# Patient Record
Sex: Female | Born: 1970 | Race: White | Hispanic: No | Marital: Married | Smoking: Never smoker
Health system: Southern US, Community
[De-identification: ages and names within clinical notes are randomized; demographics above are authoritative.]

## PROBLEM LIST (undated history)

## (undated) DIAGNOSIS — R51 Headache: Secondary | ICD-10-CM

## (undated) DIAGNOSIS — G500 Trigeminal neuralgia: Secondary | ICD-10-CM

## (undated) DIAGNOSIS — G8929 Other chronic pain: Secondary | ICD-10-CM

## (undated) DIAGNOSIS — J358 Other chronic diseases of tonsils and adenoids: Secondary | ICD-10-CM

## (undated) DIAGNOSIS — K222 Esophageal obstruction: Secondary | ICD-10-CM

## (undated) DIAGNOSIS — F419 Anxiety disorder, unspecified: Secondary | ICD-10-CM

## (undated) DIAGNOSIS — K649 Unspecified hemorrhoids: Secondary | ICD-10-CM

## (undated) DIAGNOSIS — D126 Benign neoplasm of colon, unspecified: Secondary | ICD-10-CM

## (undated) DIAGNOSIS — E559 Vitamin D deficiency, unspecified: Secondary | ICD-10-CM

## (undated) HISTORY — DX: Other chronic diseases of tonsils and adenoids: J35.8

## (undated) HISTORY — DX: Unspecified hemorrhoids: K64.9

## (undated) HISTORY — DX: Trigeminal neuralgia: G50.0

## (undated) HISTORY — DX: Benign neoplasm of colon, unspecified: D12.6

## (undated) HISTORY — DX: Esophageal obstruction: K22.2

## (undated) HISTORY — DX: Headache: R51

## (undated) HISTORY — DX: Other chronic pain: G89.29

## (undated) HISTORY — DX: Anxiety disorder, unspecified: F41.9

## (undated) HISTORY — PX: LYMPH NODE DISSECTION: SHX5087

## (undated) HISTORY — DX: Vitamin D deficiency, unspecified: E55.9

---

## 2016-11-26 DIAGNOSIS — Z68.41 Body mass index (BMI) pediatric, 5th percentile to less than 85th percentile for age: Secondary | ICD-10-CM | POA: Insufficient documentation

## 2017-03-12 DIAGNOSIS — J302 Other seasonal allergic rhinitis: Secondary | ICD-10-CM | POA: Insufficient documentation

## 2017-03-12 DIAGNOSIS — F419 Anxiety disorder, unspecified: Secondary | ICD-10-CM | POA: Insufficient documentation

## 2017-03-12 DIAGNOSIS — G518 Other disorders of facial nerve: Secondary | ICD-10-CM | POA: Insufficient documentation

## 2017-03-13 DIAGNOSIS — E559 Vitamin D deficiency, unspecified: Secondary | ICD-10-CM | POA: Insufficient documentation

## 2017-04-07 DIAGNOSIS — R1314 Dysphagia, pharyngoesophageal phase: Secondary | ICD-10-CM | POA: Insufficient documentation

## 2017-04-07 DIAGNOSIS — K219 Gastro-esophageal reflux disease without esophagitis: Secondary | ICD-10-CM | POA: Insufficient documentation

## 2017-04-08 ENCOUNTER — Other Ambulatory Visit: Payer: Self-pay | Admitting: Otolaryngology

## 2017-04-08 DIAGNOSIS — K219 Gastro-esophageal reflux disease without esophagitis: Secondary | ICD-10-CM

## 2017-04-08 DIAGNOSIS — R1314 Dysphagia, pharyngoesophageal phase: Secondary | ICD-10-CM

## 2017-07-16 ENCOUNTER — Ambulatory Visit
Admission: RE | Admit: 2017-07-16 | Discharge: 2017-07-16 | Disposition: A | Discharge status: Home / Self Care | Payer: BLUE CROSS/BLUE SHIELD | Source: Ambulatory Visit | Attending: Otolaryngology | Admitting: Otolaryngology

## 2017-07-16 ENCOUNTER — Encounter: Payer: Self-pay | Admitting: Radiology

## 2017-07-16 DIAGNOSIS — K219 Gastro-esophageal reflux disease without esophagitis: Secondary | ICD-10-CM

## 2017-07-16 DIAGNOSIS — R1314 Dysphagia, pharyngoesophageal phase: Secondary | ICD-10-CM

## 2017-08-01 ENCOUNTER — Encounter: Payer: Self-pay | Admitting: Internal Medicine

## 2017-08-29 ENCOUNTER — Encounter: Payer: Self-pay | Admitting: *Deleted

## 2017-09-10 ENCOUNTER — Encounter: Payer: Self-pay | Admitting: Internal Medicine

## 2017-09-10 ENCOUNTER — Other Ambulatory Visit (INDEPENDENT_AMBULATORY_CARE_PROVIDER_SITE_OTHER): Payer: BLUE CROSS/BLUE SHIELD

## 2017-09-10 ENCOUNTER — Ambulatory Visit (INDEPENDENT_AMBULATORY_CARE_PROVIDER_SITE_OTHER): Payer: BLUE CROSS/BLUE SHIELD | Admitting: Internal Medicine

## 2017-09-10 ENCOUNTER — Encounter (INDEPENDENT_AMBULATORY_CARE_PROVIDER_SITE_OTHER): Payer: Self-pay

## 2017-09-10 VITALS — BP 100/72 | HR 72 | Ht 61.5 in | Wt 124.4 lb

## 2017-09-10 DIAGNOSIS — R194 Change in bowel habit: Secondary | ICD-10-CM | POA: Diagnosis not present

## 2017-09-10 DIAGNOSIS — R933 Abnormal findings on diagnostic imaging of other parts of digestive tract: Secondary | ICD-10-CM | POA: Diagnosis not present

## 2017-09-10 LAB — FOLATE: FOLATE: 16.7 ng/mL (ref >5.9)

## 2017-09-10 LAB — VITAMIN B12: Vitamin B-12: 215 pg/mL (ref 211–911)

## 2017-09-10 LAB — IGA: IgA: 130 mg/dL (ref 68–378)

## 2017-09-10 MED ORDER — SUPREP BOWEL PREP KIT 17.5-3.13-1.6 GM/177ML PO SOLN: 1.0000 | ORAL | 0 refills | Status: DC

## 2017-09-10 NOTE — Patient Instructions (Signed)
You have been scheduled for an endoscopy and colonoscopy. Please follow the written instructions given to you at your visit today. Please pick up your prep supplies at the pharmacy within the next 1-3 days. If you use inhalers (even only as needed), please bring them with you on the day of your procedure. Your physician has requested that you go to www.startemmi.com and enter the access code given to you at your visit today. This web site gives a general overview about your procedure. However, you should still follow specific instructions given to you by our office regarding your preparation for the procedure.  Your physician has requested that you go to the basement for the following lab work before leaving today: Celiac, b12, folate  If you are age 46 or older, your body mass index should be between 23-30. Your Body mass index is 23.12 kg/m. If this is out of the aforementioned range listed, please consider follow up with your Primary Care Provider.  If you are age 37 or younger, your body mass index should be between 19-25. Your Body mass index is 23.12 kg/m. If this is out of the aformentioned range listed, please consider follow up with your Primary Care Provider.

## 2017-09-10 NOTE — Progress Notes (Signed)
Patient ID: Carolyn Blair, female   DOB: 03-03-1971, 46 y.o.   MRN: [id number] HPI: Carolyn Blair is a 46 year old female with a past medical history of trigeminal neuralgia, tonsillar stones, headaches, vitamin D deficiency who is seen in consultation at the request of Dr. Harmon Pier to evaluate dysphagia and abnormal barium esophagram. She is here alone today.  She states that she has been recently seen at [facility] by Dr. Nigel Bridgeman, also by ENT Dr. Janace Hoard. She developed recurrent left-sided trigeminal neuralgia with pain in her neck and left head/face, some changes in her speech as well as trouble swallowing. Her trouble swallowing she states is high in her left neck under her jaw bone. She feels like food sticks when she tries to swallow it. She does not feel esophageal dysphagia or food sticking or slowing down after swallowing. She states it is more of a problem initiating swallow rather than food going down want she swallows. She was seen by ENT and told her laryngoscopy was normal. She reportedly has a pending MRI of her brain to follow-up her neurology issues from earlier this summer. She denies heartburn, nausea and vomiting. She has had a change in bowel habit including urgent loose stools after eating. She's had some crampy lower abdominal pain. She has 3-4 loose stools per day all associated with eating. Previously she was having one formed stool per day. Loose stools do not occur every day but seemed to come in clusters of days. Worse with fast food and heavy foods such as Alfredo sauce. No blood in her stool or melena. She has noticed increase intestinal gas and some bloating. She is also noticed hair loss. She was seen by dermatology, Dr. Harlow Mares who suggested she may need a colonoscopy.   She had been using meloxicam for her headache and neck pain but has stopped this.  She had a barium esophagram which showed a smooth distal narrowing in the esophagus. The barium tablet stopped at this area and  caused esophageal dysphagia. She reports that this is not the sensation she feels in her neck.  She is married with one child. She works as an Sales promotion account executive. No tobacco use ever. No regular alcohol. No family history of celiac disease or IBD. She does have family history of pancreatic cancer in her maternal grandmother.  Her weight has been stable to slightly increased.  She is using Flexeril as needed, Xanax as needed and daily birth control pill.  Past Medical History:  Diagnosis Date  . Anxiety   . Chronic headaches   . Esophageal stricture   . Tonsil stone   . Trigeminal neuralgia   . Vitamin D deficiency     Past Surgical History:  Procedure Laterality Date  . LYMPH NODE DISSECTION Left    groin    No outpatient prescriptions prior to visit.   No facility-administered medications prior to visit.     Allergies  Allergen Reactions  . Codeine Nausea And Vomiting  . Flagyl [Metronidazole] Rash    Throat sores  . Macrobid [Nitrofurantoin] Rash    Throat sores    Family History  Problem Relation Age of Onset  . Lung cancer Mother   . Diabetes Mother   . Liver disease Sister        fatty liver  . Basal cell carcinoma Sister   . Pancreatic cancer Maternal Grandmother   . Stroke Paternal Grandmother   . Heart disease Paternal Grandfather   . Heart attack Paternal Grandfather  Social History  Substance Use Topics  . Smoking status: Never Smoker  . Smokeless tobacco: Never Used  . Alcohol use Yes     Comment: 10 beers on the weekends    ROS: As per history of present illness, otherwise negative  BP 100/72 (BP Location: Left Arm, Patient Position: Sitting, Cuff Size: Normal)   Pulse 72   Ht 5' 1.5" (1.562 m) Comment: height measured without shoes  Wt 124 lb 6 oz (56.4 kg)   LMP 08/20/2017   BMI 23.12 kg/m  Constitutional: Well-developed and well-nourished. No distress. HEENT: Normocephalic and atraumatic. Oropharynx is clear and moist.  Conjunctivae are normal.  No scleral icterus. Neck: Neck supple. Trachea midline. Cardiovascular: Normal rate, regular rhythm and intact distal pulses. No M/R/G Pulmonary/chest: Effort normal and breath sounds normal. No wheezing, rales or rhonchi. Abdominal: Soft, nontender, nondistended. Bowel sounds active throughout. There are no masses palpable. No hepatosplenomegaly. Extremities: no clubbing, cyanosis, or edema Neurological: Alert and oriented to person place and time. Skin: Skin is warm and dry.  Psychiatric: Normal mood and affect. Behavior is normal.  RELEVANT LABS AND IMAGING: Labs reviewed from care everywhere B12 217 TSH and free T4 normal Ferritin 125  ESOPHOGRAM / BARIUM SWALLOW / BARIUM TABLET STUDY   TECHNIQUE: Combined double contrast and single contrast examination performed using effervescent crystals, thick barium liquid, and thin barium liquid. The patient was observed with fluoroscopy swallowing a 13 mm barium sulphate tablet.   FLUOROSCOPY TIME:  Fluoroscopy Time:  2 minutes, 6 seconds   Radiation Exposure Index (if provided by the fluoroscopic device): 16   Number of Acquired Spot Images: 4 +multiple video loops.   COMPARISON:  None in PACs   FINDINGS: The patient ingested the thick and thin barium and effervescent crystals without difficulty. The hypopharynx distended well. There was no laryngeal penetration of the barium. The patient initiated the voluntary portion of the swallowing maneuver without difficulty. The cervical esophagus distended well. There was no cricopharyngeus muscle impression observed. The thoracic esophagus distended well down to the GE junction. Here there was mild narrowing which was not obstructive to passage of the liquid barium but was obstructive to passage of the 12 mm barium tablet. The mucosal pattern of the esophagus was normal. No gastroesophageal reflux or hiatal hernia was observed.   IMPRESSION: Normal  appearance of the voluntary portion of the swallowing maneuver. No abnormal displacement of the barium filled esophagus. Normal esophageal peristalsis.   Short-segment narrowing at the GE junction was obstructive to the passage of the barium tablet. This reproduced symptoms the patient has experienced when taking a large medication tablet. Direct visualization and consideration for possible esophageal dilation is recommended.     Electronically Signed   By: David  Martinique M.D.   On: 07/16/2017 12:42    ASSESSMENT/PLAN: 46 year old female with a past medical history of trigeminal neuralgia, tonsillar stones, headaches, vitamin D deficiency who is seen in consultation at the request of Dr. Harmon Pier to evaluate dysphagia and abnormal barium esophagram.  1. Trouble swallowing/abnormal barium esophagram -- the sensation she is feeling high in her left neck is not consistent with esophageal dysphagia. This sounds more likely to be possibly neurologic related in relation to her trigeminal neuralgia. I recommended that she follow-up with neurology and with the MRI as previously recommended. --The barium esophagram does show what is probably a benign distal esophageal stricture. Again the barium tablet did not reproduce her symptoms in her left neck but did cause esophageal dysphagia.  I recommended direct visualization and possible dilation.  We discussed the risk benefits and alternatives and she is agreeable and wishes to proceed.  2. Change in bowel habits/loose stools -- check celiac panel. Colonoscopy recommended to evaluate her change in bowel habit. Rule out IBD. We discussed the risks, benefits and alternatives and she is agreeable and wishes to proceed.  3. Hair loss -- unclear etiology. Rechecking celiac panel. Check B12 and folate.  4. Trigeminal neuralgia -- she will follow-up with neurology   Cc: Dr. Quillian Quince Pomposini

## 2017-09-11 ENCOUNTER — Other Ambulatory Visit: Payer: Self-pay

## 2017-09-11 DIAGNOSIS — E538 Deficiency of other specified B group vitamins: Secondary | ICD-10-CM

## 2017-09-11 LAB — TISSUE TRANSGLUTAMINASE, IGA: (tTG) Ab, IgA: 1 U/mL

## 2017-10-20 ENCOUNTER — Telehealth: Payer: Self-pay | Admitting: Internal Medicine

## 2017-10-20 NOTE — Telephone Encounter (Signed)
Spoke with patient and she may have a sinus infection.  States no fever, just stuffy nose. She is seeing her PCP today.  Told her to call us if she starts running a fever. Okay to take antibiotics if necessary, and decongestants. She agreed.

## 2017-10-21 ENCOUNTER — Other Ambulatory Visit: Payer: Self-pay

## 2017-10-21 ENCOUNTER — Telehealth: Payer: Self-pay | Admitting: *Deleted

## 2017-10-21 ENCOUNTER — Encounter: Payer: Self-pay | Admitting: Internal Medicine

## 2017-10-21 ENCOUNTER — Ambulatory Visit (AMBULATORY_SURGERY_CENTER): Payer: BLUE CROSS/BLUE SHIELD | Admitting: Internal Medicine

## 2017-10-21 VITALS — BP 116/74 | HR 72 | Temp 97.5°F | Resp 15 | Ht 61.0 in | Wt 124.0 lb

## 2017-10-21 DIAGNOSIS — D123 Benign neoplasm of transverse colon: Secondary | ICD-10-CM

## 2017-10-21 DIAGNOSIS — R1319 Other dysphagia: Secondary | ICD-10-CM

## 2017-10-21 DIAGNOSIS — R933 Abnormal findings on diagnostic imaging of other parts of digestive tract: Secondary | ICD-10-CM

## 2017-10-21 DIAGNOSIS — R1310 Dysphagia, unspecified: Secondary | ICD-10-CM | POA: Diagnosis not present

## 2017-10-21 DIAGNOSIS — R194 Change in bowel habit: Secondary | ICD-10-CM | POA: Diagnosis not present

## 2017-10-21 MED ORDER — RIFAXIMIN 550 MG PO TABS: 550.0000 mg | ORAL_TABLET | Freq: Three times a day (TID) | ORAL | 0 refills | Status: AC

## 2017-10-21 MED ORDER — SODIUM CHLORIDE 0.9 % IV SOLN: 500.0000 mL | INTRAVENOUS | Status: DC

## 2017-10-21 NOTE — Progress Notes (Signed)
Report to PACU, RN, vss, BBS= Clear.  

## 2017-10-21 NOTE — Op Note (Signed)
[place] Patient Name: Carolyn Blair Procedure Date: 10/21/2017 2:06 PM MRN: [id number] Endoscopist: Jerene Bears , MD Age: 46 Referring MD:  Date of Birth: May 14, 1971 Gender: Female Account #: [id number] Procedure:                Upper GI endoscopy Indications:              Dysphagia, Abnormal cine-esophagram with smooth                            narrowing in distal esophagus and transient delay                            with 13 mm barium tablet reproducing symptoms of                            dysphagia Medicines:                Monitored Anesthesia Care Procedure:                Pre-Anesthesia Assessment:                           - Prior to the procedure, a History and Physical                            was performed, and patient medications and                            allergies were reviewed. The patient's tolerance of                            previous anesthesia was also reviewed. The risks                            and benefits of the procedure and the sedation                            options and risks were discussed with the patient.                            All questions were answered, and informed consent                            was obtained. Prior Anticoagulants: The patient has                            taken no previous anticoagulant or antiplatelet                            agents. ASA Grade Assessment: II - A patient with                            mild systemic disease. After reviewing the risks  and benefits, the patient was deemed in                            satisfactory condition to undergo the procedure.                           After obtaining informed consent, the endoscope was                            passed under direct vision. Throughout the                            procedure, the patient's blood pressure, pulse, and                            oxygen saturations were monitored continuously.  The                            Endoscope was introduced through the mouth, and                            advanced to the second part of duodenum. The upper                            GI endoscopy was accomplished without difficulty.                            The patient tolerated the procedure well. Scope In: Scope Out: Findings:                 No endoscopic abnormality was evident in the                            esophagus to explain the patient's complaint of                            dysphagia. The esophageal mucosa was carefully                            inspected and appeared normal. No definite                            stricture was seen. The Z-line is regular at 38 cm                            from the incisors. It was decided, however, to                            proceed with dilation of the lower third of the                            esophagus and across the GE junction. A TTS dilator  was passed through the scope. Dilation with a                            16-17-18 mm balloon dilator was performed to 17 mm.                           The cardia and gastric fundus were normal on                            retroflexion.                           The entire examined stomach was normal.                           The examined duodenum was normal. Complications:            No immediate complications. Estimated Blood Loss:     Estimated blood loss: none. Impression:               - Normal esophagus endoscopically. No endoscopic                            esophageal abnormality to explain patient's                            dysphagia. Esophagus dilated to 17 mm.                           - Normal stomach.                           - Normal examined duodenum.                           - No specimens collected. Recommendation:           - Patient has a contact number available for                            emergencies. The signs and symptoms of  potential                            delayed complications were discussed with the                            patient. Return to normal activities tomorrow.                            Written discharge instructions were provided to the                            patient.                           - Resume previous diet.                           -  Continue present medications. Jerene Bears, MD 10/21/2017 2:47:34 PM This report has been signed electronically.

## 2017-10-21 NOTE — Progress Notes (Signed)
Called to room to assist during endoscopic procedure.  Patient ID and intended procedure confirmed with present staff. Received instructions for my participation in the procedure from the performing physician.  

## 2017-10-21 NOTE — Op Note (Signed)
[facility] Patient Name: Carolyn Blair Procedure Date: 10/21/2017 2:05 PM MRN: [id number] Endoscopist: Jerene Bears , MD Age: 46 Referring MD:  Date of Birth: 12-08-1970 Gender: Female Account #: [id number] Procedure:                Colonoscopy Indications:              Change in bowel habits Medicines:                Monitored Anesthesia Care Procedure:                Pre-Anesthesia Assessment:                           - Prior to the procedure, a History and Physical                            was performed, and patient medications and                            allergies were reviewed. The patient's tolerance of                            previous anesthesia was also reviewed. The risks                            and benefits of the procedure and the sedation                            options and risks were discussed with the patient.                            All questions were answered, and informed consent                            was obtained. Prior Anticoagulants: The patient has                            taken no previous anticoagulant or antiplatelet                            agents. ASA Grade Assessment: II - A patient with                            mild systemic disease. After reviewing the risks                            and benefits, the patient was deemed in                            satisfactory condition to undergo the procedure.                           After obtaining informed consent, the colonoscope  was passed under direct vision. Throughout the                            procedure, the patient's blood pressure, pulse, and                            oxygen saturations were monitored continuously. The                            Colonoscope was introduced through the anus and                            advanced to the the terminal ileum. The colonoscopy                            was performed without difficulty. The  patient                            tolerated the procedure well. The quality of the                            bowel preparation was excellent. The terminal                            ileum, ileocecal valve, appendiceal orifice, and                            rectum were photographed. Scope In: 2:30:58 PM Scope Out: 2:41:48 PM Scope Withdrawal Time: 0 hours 7 minutes 41 seconds  Total Procedure Duration: 0 hours 10 minutes 50 seconds  Findings:                 The digital rectal exam was normal.                           The terminal ileum appeared normal.                           A 4 mm polyp was found in the transverse colon. The                            polyp was sessile. The polyp was removed with a                            cold snare. Resection and retrieval were complete.                           Internal and small external hemorrhoids were found                            during retroflexion. The hemorrhoids were small. Complications:            No immediate complications. Estimated Blood Loss:     Estimated blood loss was minimal. Impression:               -  The examined portion of the ileum was normal.                           - One 4 mm polyp in the transverse colon, removed                            with a cold snare. Resected and retrieved.                           - Small hemorrhoids. Recommendation:           - Patient has a contact number available for                            emergencies. The signs and symptoms of potential                            delayed complications were discussed with the                            patient. Return to normal activities tomorrow.                            Written discharge instructions were provided to the                            patient.                           - Resume previous diet.                           - Continue present medications.                           - Await pathology results.                            - Repeat colonoscopy is recommended. The                            colonoscopy date will be determined after pathology                            results from today's exam become available for                            review. Jerene Bears, MD 10/21/2017 2:50:13 PM This report has been signed electronically.

## 2017-10-21 NOTE — Patient Instructions (Addendum)
YOU HAD AN ENDOSCOPIC PROCEDURE TODAY AT [facility]:   Refer to the procedure report that was given to you for any specific questions about what was found during the examination.  If the procedure report does not answer your questions, please call your gastroenterologist to clarify.  If you requested that your care partner not be given the details of your procedure findings, then the procedure report has been included in a sealed envelope for you to review at your convenience later.  YOU SHOULD EXPECT: Some feelings of bloating in the abdomen. Passage of more gas than usual.  Walking can help get rid of the air that was put into your GI tract during the procedure and reduce the bloating. If you had a lower endoscopy (such as a colonoscopy or flexible sigmoidoscopy) you may notice spotting of blood in your stool or on the toilet paper. If you underwent a bowel prep for your procedure, you may not have a normal bowel movement for a few days.  Please Note:  You might notice some irritation and congestion in your nose or some drainage.  This is from the oxygen used during your procedure.  There is no need for concern and it should clear up in a day or so.  SYMPTOMS TO REPORT IMMEDIATELY:   Following lower endoscopy (colonoscopy or flexible sigmoidoscopy):  Excessive amounts of blood in the stool  Significant tenderness or worsening of abdominal pains  Swelling of the abdomen that is new, acute  Fever of 100F or higher   Following upper endoscopy (EGD)  Vomiting of blood or coffee ground material  New chest pain or pain under the shoulder blades  Painful or persistently difficult swallowing  New shortness of breath  Fever of 100F or higher  Black, tarry-looking stools  For urgent or emergent issues, a gastroenterologist can be reached at any hour by calling [phone].   DIET:   Follow Dilation Diet.  ACTIVITY:  You should plan to take it easy for the rest of today and  you should NOT DRIVE or use heavy machinery until tomorrow (because of the sedation medicines used during the test).    FOLLOW UP: Our staff will call the number listed on your records the next business day following your procedure to check on you and address any questions or concerns that you may have regarding the information given to you following your procedure. If we do not reach you, we will leave a message.  However, if you are feeling well and you are not experiencing any problems, there is no need to return our call.  We will assume that you have returned to your regular daily activities without incident.  If any biopsies were taken you will be contacted by phone or by letter within the next 1-3 weeks.  Please call us at [phone] if you have not heard about the biopsies in 3 weeks.    SIGNATURES/CONFIDENTIALITY: You and/or your care partner have signed paperwork which will be entered into your electronic medical record.  These signatures attest to the fact that that the information above on your After Visit Summary has been reviewed and is understood.  Full responsibility of the confidentiality of this discharge information lies with you and/or your care-partner.   Resume medications. Information given on Dilation Diet, Hemorrhoids and polyps.

## 2017-10-21 NOTE — Telephone Encounter (Signed)
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 Endoscopically not IBD JMP

## 2017-10-21 NOTE — Progress Notes (Signed)
No changes in health history since pre visit.

## 2017-10-21 NOTE — Telephone Encounter (Signed)
Rx sent to [facility] for [place].

## 2017-10-22 ENCOUNTER — Telehealth: Payer: Self-pay | Admitting: Internal Medicine

## 2017-10-22 ENCOUNTER — Telehealth: Payer: Self-pay

## 2017-10-22 NOTE — Telephone Encounter (Signed)
  Follow up Call-  Call back number 10/21/2017  Post procedure Call Back phone  # [phone]  Permission to leave phone message Yes     Patient questions:  Do you have a fever, pain , or abdominal swelling? No. Pain Score  0 *  Have you tolerated food without any problems? Yes.    Have you been able to return to your normal activities? Yes.    Do you have any questions about your discharge instructions: Diet   No. Medications  No. Follow up visit  No.  Do you have questions or concerns about your Care? No.  Actions: * If pain score is 4 or above: No action needed, pain <4.  No problems noted per pt. maw

## 2017-10-22 NOTE — Telephone Encounter (Signed)
Records faxed to [phone]

## 2017-10-27 ENCOUNTER — Encounter: Payer: Self-pay | Admitting: Internal Medicine

## 2017-10-29 NOTE — Telephone Encounter (Signed)
[facility] has approved patient's Xifaxan 550 mg from 10/29/17-11/12/17. Reference #:[id number]

## 2017-10-30 NOTE — Telephone Encounter (Signed)
[name] calling from [facility] states Carolyn Blair was approved and will be reaching out to the patient to set up delivery.

## 2017-11-26 DIAGNOSIS — D126 Benign neoplasm of colon, unspecified: Secondary | ICD-10-CM | POA: Insufficient documentation

## 2017-12-24 ENCOUNTER — Telehealth: Payer: Self-pay | Admitting: *Deleted

## 2017-12-24 NOTE — Telephone Encounter (Signed)
Thanks Dottie  I am okay with 3 month trial of oral B12 1000 mcg daily and then repeat B12 If still low, then we can begin nasal or IM B12

## 2017-12-24 NOTE — Telephone Encounter (Signed)
Patient has been advised that she may try full 3 month trial of oral b12 supplemenation and recheck of b12 levels in 3 months. She verbalizes understanding.

## 2017-12-24 NOTE — Telephone Encounter (Signed)
Dr Hilarie Fredrickson has received lab results from 12/15/17 done at [facility] showing Viatmin B12 level of 264 pg/ml. Per Dr Hilarie Fredrickson, "B12 is still low normal despite 3 months of oral supplementation. Patient with b12 les than 400 can experience symptoms. I recommend nasal or IM supplementation with either Nascobal 1 spray in one nostril every week or B12 1000 mcg IM every month. Repeat B12 3 months after start of new therapy. JMP"  I have contacted patient to advise of Dr Vena Rua recommendations. Patient states that she actually only took oral b12 supplementation for 1 month around November but then stopped before colonoscopy because she didn't want anything to interfere with the procedure. She states she never started back. She questions if you would be okay with her trying to do oral supplementation on a daily basis again for a 3 month trial and then rechecking b12 level once more to see if she can get her levels up. Are you okay with this or would you recommendations remain the same at this point?

## 2018-03-11 DIAGNOSIS — Z0000 Encounter for general adult medical examination without abnormal findings: Secondary | ICD-10-CM | POA: Insufficient documentation

## 2018-03-20 ENCOUNTER — Telehealth: Payer: Self-pay | Admitting: Internal Medicine

## 2018-03-20 NOTE — Telephone Encounter (Signed)
Discussed with pt that she should keep the appt for the [place] with her PCP and if there is a GI issue found then they would likely refer her back to our office. Pt verbalized understanding.

## 2018-03-26 ENCOUNTER — Telehealth: Payer: Self-pay | Admitting: *Deleted

## 2018-03-26 NOTE — Telephone Encounter (Signed)
Dr Hilarie Fredrickson has received labs from patient's PCP, Dr Jamse Arn and has reviewed. B12 level is 669 as of 03/11/18. Per Dr Hilarie Fredrickson, "ok with oral B12; stay on 1000 mcg daily. JMP"  I have left a voicemail advising patient of this information.

## 2018-08-14 ENCOUNTER — Encounter: Payer: Self-pay | Admitting: *Deleted

## 2018-08-18 ENCOUNTER — Ambulatory Visit (INDEPENDENT_AMBULATORY_CARE_PROVIDER_SITE_OTHER): Payer: BLUE CROSS/BLUE SHIELD | Admitting: Internal Medicine

## 2018-08-18 ENCOUNTER — Encounter

## 2018-08-18 ENCOUNTER — Encounter: Payer: Self-pay | Admitting: Internal Medicine

## 2018-08-18 VITALS — BP 128/70 | HR 72 | Ht 61.5 in | Wt 121.0 lb

## 2018-08-18 DIAGNOSIS — K580 Irritable bowel syndrome with diarrhea: Secondary | ICD-10-CM | POA: Diagnosis not present

## 2018-08-18 DIAGNOSIS — E538 Deficiency of other specified B group vitamins: Secondary | ICD-10-CM | POA: Diagnosis not present

## 2018-08-18 DIAGNOSIS — G500 Trigeminal neuralgia: Secondary | ICD-10-CM

## 2018-08-18 DIAGNOSIS — Z86010 Personal history of colonic polyps: Secondary | ICD-10-CM | POA: Diagnosis not present

## 2018-08-18 NOTE — Patient Instructions (Signed)
Please take the Xifaxan previously prescribed.  If you are age 47 or older, your body mass index should be between 23-30. Your Body mass index is 22.49 kg/m. If this is out of the aforementioned range listed, please consider follow up with your Primary Care Provider.  If you are age 64 or younger, your body mass index should be between 19-25. Your Body mass index is 22.49 kg/m. If this is out of the aformentioned range listed, please consider follow up with your Primary Care Provider.

## 2018-08-18 NOTE — Progress Notes (Signed)
Subjective:    Patient ID: Carolyn Blair, female    DOB: 1971/03/15, 47 y.o.   MRN: [id number]  HPI Carolyn Blair is a 47 year old female with a past medical history of distal esophageal stricture status post dilation, nonadvanced adenomatous colon polyp, trigeminal neuralgia, headaches, vitamin D deficiency who is here for follow-up.  I initially saw her in the clinic on 09/10/2017 and then for upper endoscopy and colonoscopy performed on 10/21/2017.  She is here today alone.  Her upper endoscopy did not show any evidence of esophageal stricture but a barium esophagram had revealed a smooth narrowing.  We performed balloon dilation a 17 mm in the lower esophagus and across the GE junction.  Prior to this she was having issues with swallowing and food sticking during eating.  Subsequent to this dilation she reports this symptom has resolved.  The remainder of her stomach was normal as was the examined duodenum.  Her colonoscopy was normal to the terminal ileum with the exception of a 4 mm tubular adenoma removed from the transverse colon.  Small hemorrhoids were found on retroflexion.  After her colonoscopy I had prescribed rifaximin for crampy abdominal pain, bloating and intermittent loose stools.  She never took this medication but does have it at home.  She wanted to wait until we further discussed it.  She has been feeling mostly well though she had an episode of fairly severe upper abdominal which occurred in April 2019.  This lasted about 4 hours and was in the epigastrium.  She was then sore for quite some time thereafter.  This led to an ultrasound which was negative for pathology and a HIDA scan which I reviewed from July 2019.  The HIDA scan showed no evidence for acute or chronic cholecystitis and the gallbladder ejection fraction was normal at 94%.  She has not had a recurrent attack of such severe pain but occasionally does feel soreness in her upper abdomen like she has pulled a  muscle.  Bowel habits have remained somewhat erratic with occasional loose stools, frequent lower gas and occasional bloating.  No blood in her stool or melena.  She is continue oral B12 and we check B12 levels in April and this had normalized.  She has been using dry needling and cupping for her trigeminal neuralgia and this has been effective.  She still occasionally needs meloxicam for pain   Review of Systems As per HPI, otherwise negative  Current Medications, Allergies, Past Medical History, Past Surgical History, Family History and Social History were reviewed in [employer] record.     Objective:   Physical Exam BP 128/70   Pulse 72   Ht 5' 1.5" (1.562 m)   Wt 121 lb (54.9 kg)   BMI 22.49 kg/m  Constitutional: Well-developed and well-nourished. No distress. HEENT: Normocephalic and atraumatic.   No scleral icterus. Neck: Neck supple. Trachea midline. Cardiovascular: Normal rate, regular rhythm and intact distal pulses. No M/R/G Pulmonary/chest: Effort normal and breath sounds normal. No wheezing, rales or rhonchi. Abdominal: Soft, nontender, nondistended. Bowel sounds active throughout. There are no masses palpable. No hepatosplenomegaly. Extremities: no clubbing, cyanosis, or edema Neurological: Alert and oriented to person place and time. Skin: Skin is warm and dry. Psychiatric: Normal mood and affect. Behavior is normal.      Assessment & Plan:  47 year old female with a past medical history of distal esophageal stricture status post dilation, nonadvanced adenomatous colon polyp, trigeminal neuralgia, headaches, vitamin D deficiency who is here  for follow-up.  1.  Loose stools/abdominal bloating/flatulence symptom --very likely irritable bowel related after previous evaluation.  I have encouraged her to proceed with the rifaximin prescription which she has already purchased.  This will be 550 mg 3 times daily x14 days.  I asked her let me know  how her symptoms respond to this therapy.  2.  Esophageal stricture/dysphagia --not seen endoscopically but suggested by barium swallow.  All symptoms improved after esophageal dilation.  This can be repeated as needed  3.  Adenomatous colon polyp --4 mm adenoma, repeat colonoscopy interval 5 years is recommended  4.  B12 deficiency --improving with oral B12.  Continue 1000 mcg daily  5.  Trigeminal neuralgia --responding to nonpharmacologic therapy with dry needling and cupping.  These treatments will continue.  25 minutes spent with the patient today. Greater than 50% was spent in counseling and coordination of care with the patient

## 2019-04-14 IMAGING — RF DG ESOPHAGUS
8 of 10 series · 14 of 24 positions shown · non-contrast
Comparison: None in PACs

CLINICAL DATA: Trigeminal neuralgia, laryngopharynx dual reflux,
pharyngoesophageal dysphasia

EXAM:
ESOPHOGRAM / BARIUM SWALLOW / BARIUM TABLET STUDY
TECHNIQUE: Combined double contrast and single contrast examination performed
using effervescent crystals, thick barium liquid, and thin barium
liquid. The patient was observed with fluoroscopy swallowing a 13 mm
barium sulphate tablet.
FLUOROSCOPY TIME:  Fluoroscopy Time:  2 minutes, 6 seconds
Radiation Exposure Index (if provided by the fluoroscopic device):
16
Number of Acquired Spot Images: 4 +multiple video loops.

[Series 1: sequence · 2 of 14 frames shown (1 of 7)]
[frame 1/14]
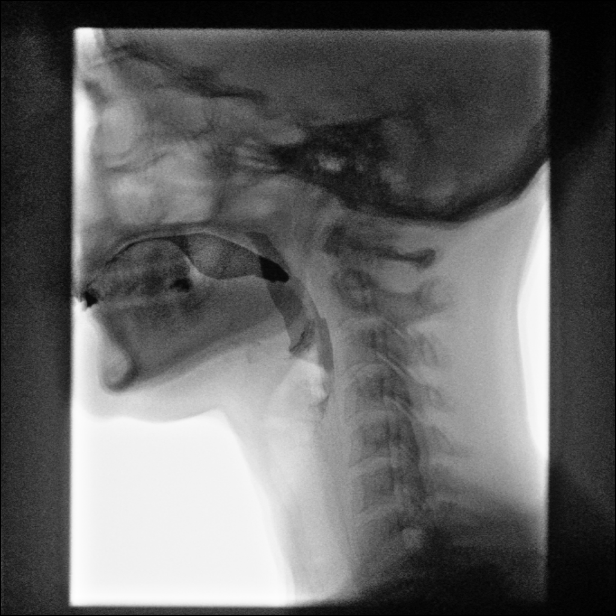
[frame 12/14]
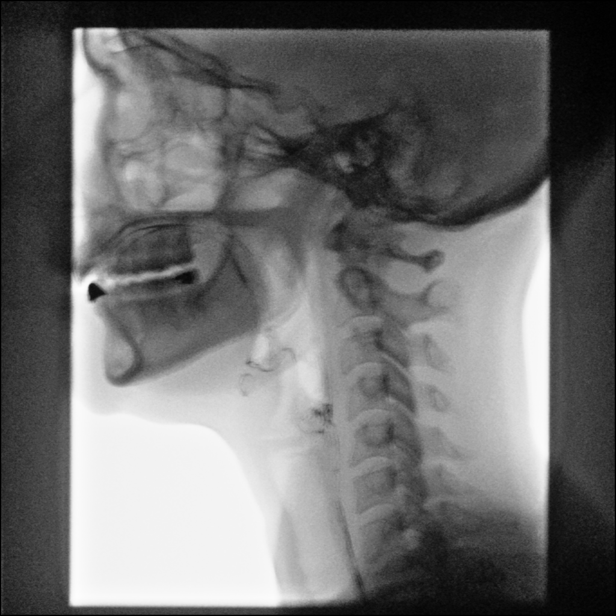

[Series 3: sequence · 1 of 7 frames shown (2 of 7)]
[frame 2/7]
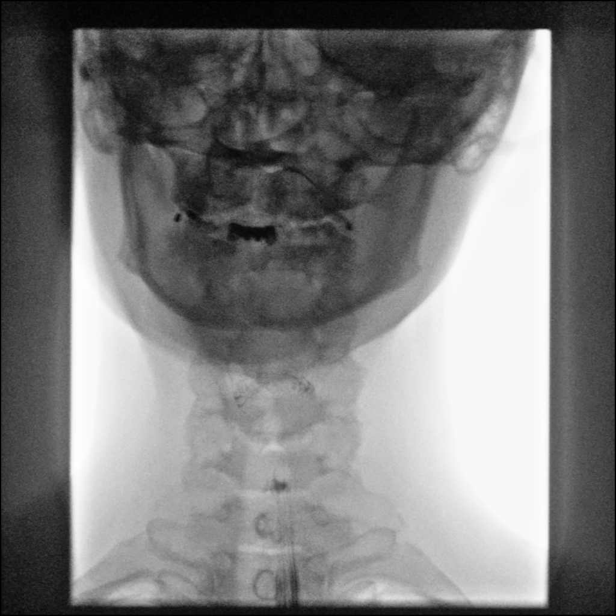

[Series 4: one shot · 1 of 1 slices shown]
[im 1/1]
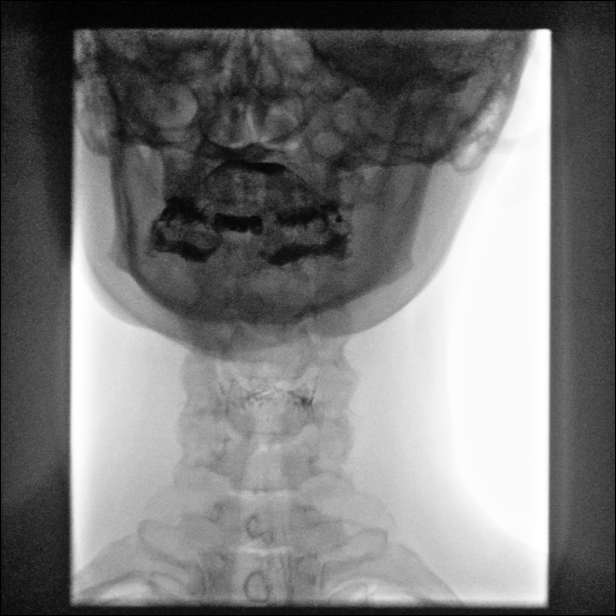

[Series 5: sequence · 2 of 43 frames shown (3 of 7)]
[frame 7/43]
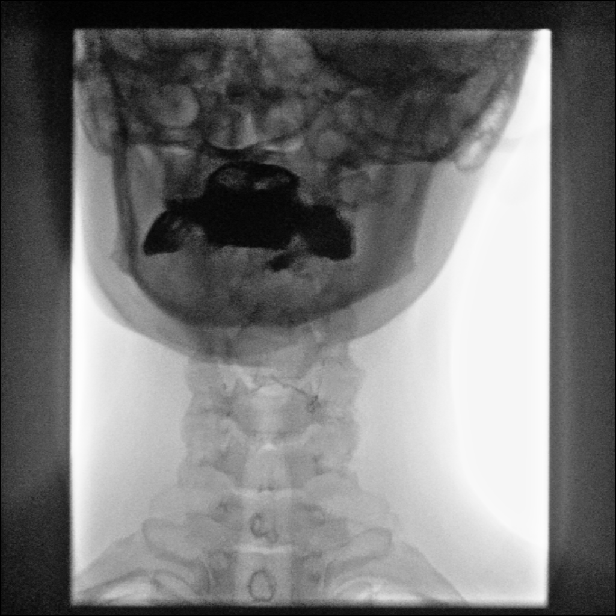
[frame 24/43]
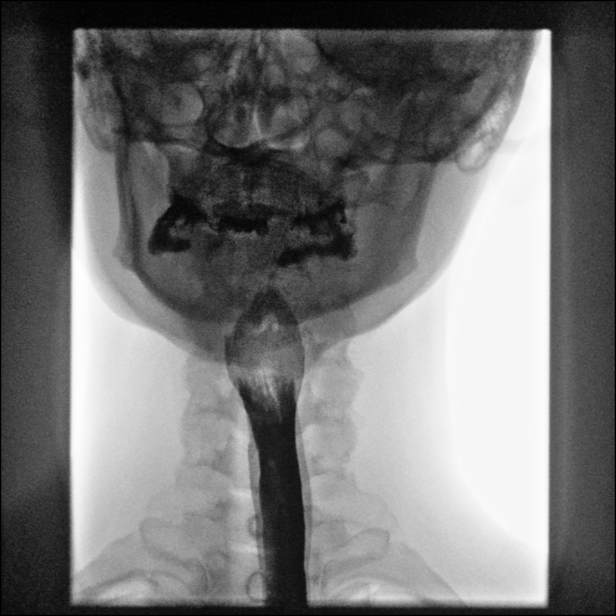

[Series 6: sequence · 2 of 35 frames shown (4 of 7)]
[frame 6/35]
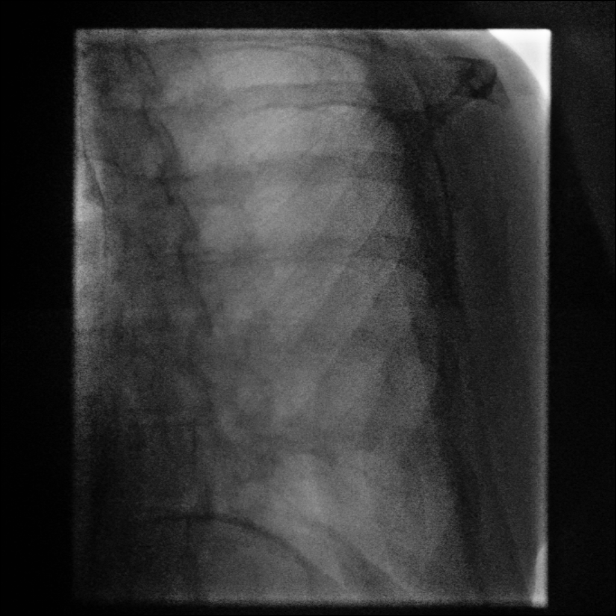
[frame 18/35]
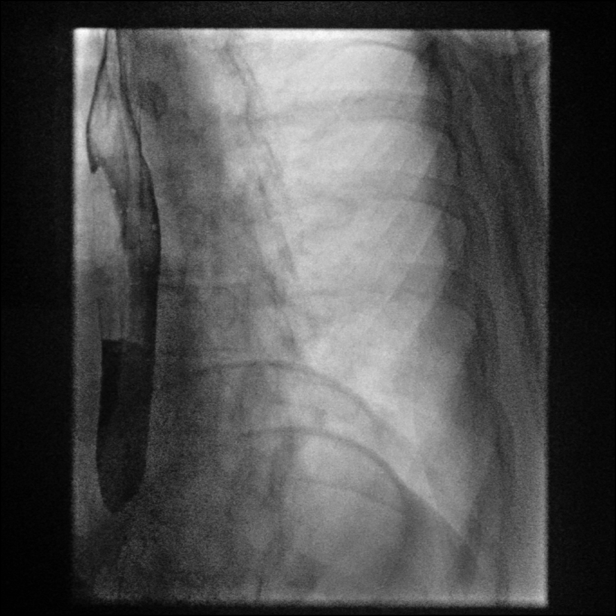

[Series 7: sequence · 2 of 22 frames shown (5 of 7)]
[frame 4/22]
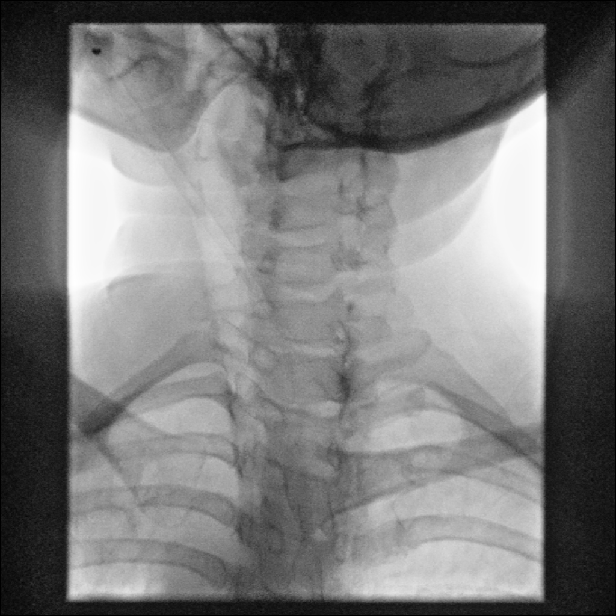
[frame 19/22]
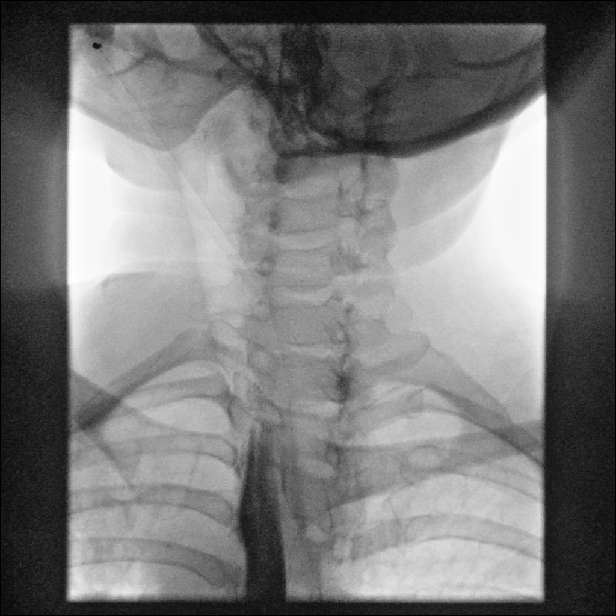

[Series 8: sequence · 2 of 90 frames shown (6 of 7)]
[frame 46/90]
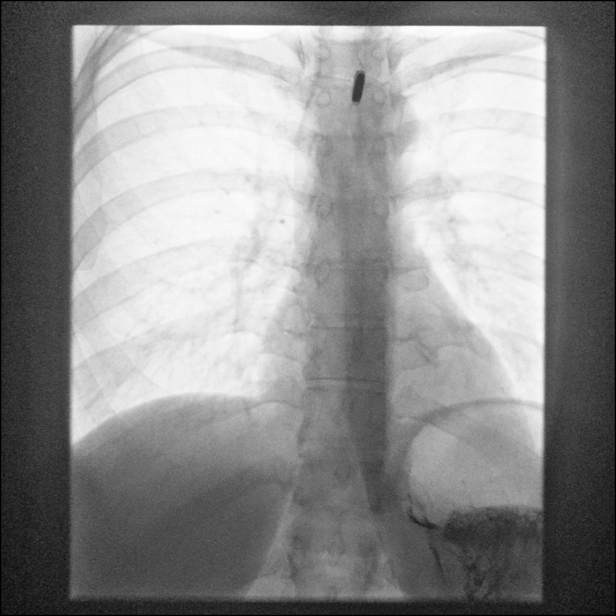
[frame 77/90]
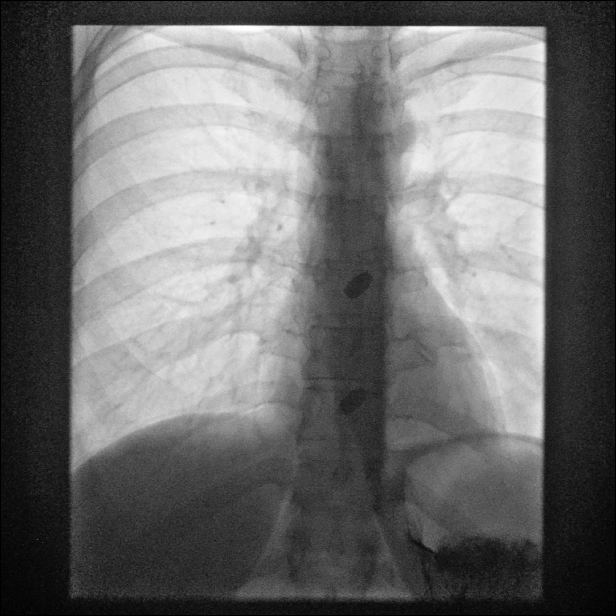

[Series 10: sequence · 2 of 29 frames shown (7 of 7)]
[frame 2/29]
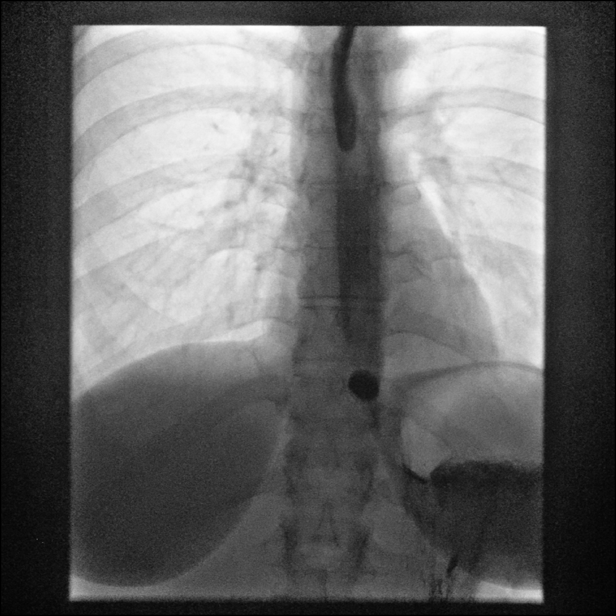
[frame 25/29]
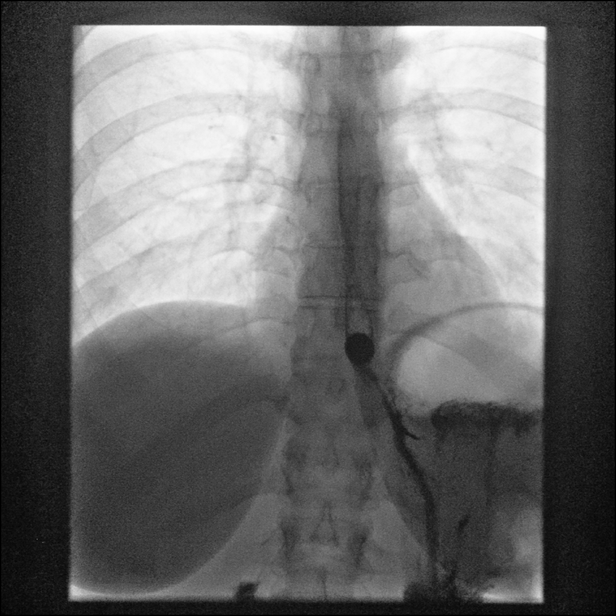

[14 of 24 positions shown; findings below may reference images not displayed]

FINDINGS: The patient ingested the thick and thin barium and effervescent
crystals without difficulty. The hypopharynx distended well. There
was no laryngeal penetration of the barium. The patient initiated
the voluntary portion of the swallowing maneuver without difficulty.
The cervical esophagus distended well. There was no cricopharyngeus
muscle impression observed. The thoracic esophagus distended well
down to the GE junction. Here there was mild narrowing which was not
obstructive to passage of the liquid barium but was obstructive to
passage of the 12 mm barium tablet. The mucosal pattern of the
esophagus was normal. No gastroesophageal reflux or hiatal hernia
was observed.
IMPRESSION: Normal appearance of the voluntary portion of the swallowing
maneuver. No abnormal displacement of the barium filled esophagus.
Normal esophageal peristalsis.

Short-segment narrowing at the GE junction was obstructive to the
passage of the barium tablet. This reproduced symptoms the patient
has experienced when taking a large medication tablet. Direct
visualization and consideration for possible esophageal dilation is
recommended.

## 2020-05-16 DIAGNOSIS — U071 COVID-19: Secondary | ICD-10-CM | POA: Insufficient documentation

## 2021-09-17 DIAGNOSIS — M542 Cervicalgia: Secondary | ICD-10-CM | POA: Insufficient documentation

## 2021-09-17 DIAGNOSIS — R202 Paresthesia of skin: Secondary | ICD-10-CM | POA: Insufficient documentation

## 2021-09-25 HISTORY — PX: OTHER SURGICAL HISTORY: SHX169

## 2022-12-10 ENCOUNTER — Encounter: Payer: Self-pay | Admitting: Internal Medicine

## 2023-01-16 ENCOUNTER — Encounter: Payer: Self-pay | Admitting: Internal Medicine

## 2023-03-21 ENCOUNTER — Ambulatory Visit (AMBULATORY_SURGERY_CENTER): Payer: BC Managed Care – PPO | Admitting: *Deleted

## 2023-03-21 VITALS — Ht 61.5 in | Wt 125.0 lb

## 2023-03-21 DIAGNOSIS — Z86010 Personal history of colonic polyps: Secondary | ICD-10-CM

## 2023-03-21 MED ORDER — NA SULFATE-K SULFATE-MG SULF 17.5-3.13-1.6 GM/177ML PO SOLN: 1.0000 | Freq: Once | ORAL | 0 refills | Status: AC

## 2023-03-21 NOTE — Progress Notes (Signed)
Pt's name and DOB verified at the beginning of the pre-visit.  Pt denies any difficulty with ambulating.  No egg or soy allergy known to patient  No issues known to pt with past sedation with any surgeries or procedures Pt denies having issues being intubated Pt has no issues moving head neck on occational with swallowing No FH of Malignant Hyperthermia Pt is not on diet pills Pt is not on home 02  Pt is not on blood thinners  Pt denies issues with constipation  Pt is not on dialysis Pt denies any upcoming cardiac testing Pt encouraged to use to use Singlecare or Goodrx to reduce cost  Patient's chart reviewed by Cathlyn Parsons CNRA prior to pre-visit and patient appropriate for the [facility].  Pre-visit completed and red dot placed by patient's name on their procedure day (on provider's schedule).  . Visit by phone Pt states weight is 125 lb Instructed pt why it is important to and  to call if they have any changes in health or new medications. Directed them to the # given and on instructions.   Pt states they will.  Instructions reviewed with pt and pt states understanding. Instructed to review again prior to procedure. Pt states they will.  Instructions sent by mail with coupon

## 2023-04-07 ENCOUNTER — Encounter: Payer: Self-pay | Admitting: Internal Medicine

## 2023-04-18 ENCOUNTER — Ambulatory Visit (AMBULATORY_SURGERY_CENTER): Payer: BC Managed Care – PPO | Admitting: Internal Medicine

## 2023-04-18 ENCOUNTER — Encounter: Payer: Self-pay | Admitting: Internal Medicine

## 2023-04-18 VITALS — BP 142/83 | HR 74 | Temp 98.0°F | Resp 13 | Ht 61.0 in | Wt 125.0 lb

## 2023-04-18 DIAGNOSIS — Z09 Encounter for follow-up examination after completed treatment for conditions other than malignant neoplasm: Secondary | ICD-10-CM | POA: Diagnosis present

## 2023-04-18 DIAGNOSIS — Z86010 Personal history of colonic polyps: Secondary | ICD-10-CM | POA: Diagnosis not present

## 2023-04-18 MED ORDER — SODIUM CHLORIDE 0.9 % IV SOLN: 500.0000 mL | Freq: Once | INTRAVENOUS | Status: DC

## 2023-04-18 NOTE — Patient Instructions (Signed)
Resume previous diet Continue present medications There were no colon polyps seen today!   You will need another screening colonoscopy in 10 years, you will receive a letter at that time when you are due for the procedure.    Please call us at [phone] if you have a change in bowel habits, change in family history of colo-rectal cancer, rectal bleeding or other GI concern before that time. Handouts/information given for hemorrhoids  YOU HAD AN ENDOSCOPIC PROCEDURE TODAY AT THE [facility]:   Refer to the procedure report that was given to you for any specific questions about what was found during the examination.  If the procedure report does not answer your questions, please call your gastroenterologist to clarify.  If you requested that your care partner not be given the details of your procedure findings, then the procedure report has been included in a sealed envelope for you to review at your convenience later.  YOU SHOULD EXPECT: Some feelings of bloating in the abdomen. Passage of more gas than usual.  Walking can help get rid of the air that was put into your GI tract during the procedure and reduce the bloating. If you had a lower endoscopy (such as a colonoscopy or flexible sigmoidoscopy) you may notice spotting of blood in your stool or on the toilet paper. If you underwent a bowel prep for your procedure, you may not have a normal bowel movement for a few days. Please Note:  You might notice some irritation and congestion in your nose or some drainage.  This is from the oxygen used during your procedure.  There is no need for concern and it should clear up in a day or so.  SYMPTOMS TO REPORT IMMEDIATELY:  Following lower endoscopy (colonoscopy):  Excessive amounts of blood in the stool  Significant tenderness or worsening of abdominal pains  Swelling of the abdomen that is new, acute  Fever of 100F or higher For urgent or emergent issues, a gastroenterologist can be  reached at any hour by calling [phone]. Do not use MyChart messaging for urgent concerns.   DIET:  We do recommend a small meal at first, but then you may proceed to your regular diet.  Drink plenty of fluids but you should avoid alcoholic beverages for 24 hours.  ACTIVITY:  You should plan to take it easy for the rest of today and you should NOT DRIVE or use heavy machinery until tomorrow (because of the sedation medicines used during the test).    FOLLOW UP: Our staff will call the number listed on your records the next business day following your procedure.  We will call around 7:15- 8:00 am to check on you and address any questions or concerns that you may have regarding the information given to you following your procedure. If we do not reach you, we will leave a message.     SIGNATURES/CONFIDENTIALITY: You and/or your care partner have signed paperwork which will be entered into your electronic medical record.  These signatures attest to the fact that that the information above on your After Visit Summary has been reviewed and is understood.  Full responsibility of the confidentiality of this discharge information lies with you and/or your care-partner.

## 2023-04-18 NOTE — Progress Notes (Signed)
Sedate, gd SR, tolerated procedure well, VSS, report to RN 

## 2023-04-18 NOTE — Op Note (Signed)
Carolyn Blair Patient Name: Carolyn Blair Procedure Date: 04/18/2023 11:49 AM MRN: [id number] Endoscopist: Beverley Fiedler , MD, [id number] Age: 52 Referring MD:  Date of Birth: January 21, 1971 Gender: Female Account #: [id number] Procedure:                Colonoscopy Indications:              High risk colon cancer surveillance: Personal                            history of non-advanced adenoma, Last colonoscopy:                            November 2018 Medicines:                Monitored Anesthesia Care Procedure:                Pre-Anesthesia Assessment:                           - Prior to the procedure, a History and Physical                            was performed, and patient medications and                            allergies were reviewed. The patient's tolerance of                            previous anesthesia was also reviewed. The risks                            and benefits of the procedure and the sedation                            options and risks were discussed with the patient.                            All questions were answered, and informed consent                            was obtained. Prior Anticoagulants: The patient has                            taken no anticoagulant or antiplatelet agents. ASA                            Grade Assessment: II - A patient with mild systemic                            disease. After reviewing the risks and benefits,                            the patient was deemed in satisfactory condition to  undergo the procedure.                           After obtaining informed consent, the colonoscope                            was passed under direct vision. Throughout the                            procedure, the patient's blood pressure, pulse, and                            oxygen saturations were monitored continuously. The                            [id number]-HQ190L Colonoscope [id number] was introduced                             through the anus and advanced to the cecum,                            identified by appendiceal orifice and ileocecal                            valve. The colonoscopy was performed without                            difficulty. The patient tolerated the procedure                            well. The quality of the bowel preparation was                            excellent. The ileocecal valve, appendiceal                            orifice, and rectum were photographed. Scope In: 11:58:07 AM Scope Out: 12:07:19 PM Scope Withdrawal Time: 0 hours 6 minutes 58 seconds  Total Procedure Duration: 0 hours 9 minutes 12 seconds  Findings:                 Hemorrhoids were found on perianal exam.                           The colon (entire examined portion) appeared normal.                           External and internal hemorrhoids were found during                            retroflexion and during digital exam. The                            hemorrhoids were small. Complications:            No immediate complications. Estimated Blood Loss:  Estimated blood loss: none. Impression:               - The entire examined colon is normal.                           - Small external and internal hemorrhoids.                           - No specimens collected. Recommendation:           - Patient has a contact number available for                            emergencies. The signs and symptoms of potential                            delayed complications were discussed with the                            patient. Return to normal activities tomorrow.                            Written discharge instructions were provided to the                            patient.                           - Resume previous diet.                           - Continue present medications.                           - Repeat colonoscopy in 10 years for surveillance. Beverley Fiedler, MD 04/18/2023  12:09:35 PM This report has been signed electronically.

## 2023-04-18 NOTE — Progress Notes (Signed)
Vitals-DT  Pt's states no medical or surgical changes since previsit or office visit.  

## 2023-04-18 NOTE — Progress Notes (Signed)
GASTROENTEROLOGY PROCEDURE H&P NOTE   Primary Care Physician: Pomposini, Rande Brunt, MD    Reason for Procedure:  History of adenomatous colon polyp  Plan:    Colonoscopy  Patient is appropriate for endoscopic procedure(s) in the ambulatory ([facility]) setting.  The nature of the procedure, as well as the risks, benefits, and alternatives were carefully and thoroughly reviewed with the patient. Ample time for discussion and questions allowed. The patient understood, was satisfied, and agreed to proceed.     HPI: Carolyn Blair is a 52 y.o. female who presents for surveillance colonoscopy.  Medical history as below.  Tolerated the prep.  No recent chest pain or shortness of breath.  No abdominal pain today.  Past Medical History:  Diagnosis Date   Anxiety    Chronic headaches    Esophageal stricture    Hemorrhoids    Tonsil stone    Trigeminal neuralgia    Tubular adenoma of colon    Vitamin D deficiency     Past Surgical History:  Procedure Laterality Date   breast fibroid removal  09/2021   LYMPH NODE DISSECTION Left    groin    Prior to Admission medications   Medication Sig Start Date End Date Taking? Authorizing Provider  Norgestimate-Ethinyl Estradiol Triphasic (TRI-PREVIFEM) 0.18/0.215/0.25 MG-35 MCG tablet Take 1 tablet by mouth daily. 02/27/17  Yes [provider]  ALPRAZolam Prudy Feeler) 0.5 MG tablet Take 1 tablet by mouth as needed. 12/12/10   [provider]  cyclobenzaprine (FLEXERIL) 5 MG tablet Take 1 tablet by mouth 3 (three) times daily as needed. 09/23/15   [provider]  gabapentin (NEURONTIN) 300 MG capsule Take 1 capsule as needed by oral route. 05/08/22   [provider]  hydrocortisone 2.5 % cream Apply topically. 12/08/22   [provider]  ibuprofen (ADVIL,MOTRIN) 800 MG tablet Take 1 tablet by mouth as needed. 12/12/10   [provider]  loratadine (CLARITIN) 10 MG tablet Take 1 tablet by mouth daily.  03/08/17   [provider]    Current Outpatient Medications  Medication Sig Dispense Refill   Norgestimate-Ethinyl Estradiol Triphasic (TRI-PREVIFEM) 0.18/0.215/0.25 MG-35 MCG tablet Take 1 tablet by mouth daily.     ALPRAZolam (XANAX) 0.5 MG tablet Take 1 tablet by mouth as needed.     cyclobenzaprine (FLEXERIL) 5 MG tablet Take 1 tablet by mouth 3 (three) times daily as needed.     gabapentin (NEURONTIN) 300 MG capsule Take 1 capsule as needed by oral route.     hydrocortisone 2.5 % cream Apply topically.     ibuprofen (ADVIL,MOTRIN) 800 MG tablet Take 1 tablet by mouth as needed.     loratadine (CLARITIN) 10 MG tablet Take 1 tablet by mouth daily.     Current Facility-Administered Medications  Medication Dose Route Frequency Provider Last Rate Last Admin   0.9 %  sodium chloride infusion  500 mL Intravenous Once Tamiah Dysart, Carie Caddy, MD        Allergies as of 04/18/2023 - Review Complete 04/18/2023  Allergen Reaction Noted   Codeine Nausea And Vomiting 09/10/2017   Flagyl [metronidazole] Rash 09/10/2017   Sulfa antibiotics Hives 03/21/2023   Macrobid [nitrofurantoin] Rash 09/10/2017    Family History  Problem Relation Age of Onset   Lung cancer Mother    Diabetes Mother    Liver disease Sister        fatty liver   Basal cell carcinoma Sister    Pancreatic cancer Maternal Grandmother  Stroke Paternal Grandmother    Heart disease Paternal Grandfather    Heart attack Paternal Grandfather    Colon cancer Neg Hx    Colon polyps Neg Hx    Esophageal cancer Neg Hx    Rectal cancer Neg Hx    Stomach cancer Neg Hx     Social History   Socioeconomic History   Marital status: Married    Spouse name: Not on file   Number of children: 1   Years of education: Not on file   Highest education level: Not on file  Occupational History   Occupation: Nature conservation officer  Tobacco Use   Smoking status: Never   Smokeless tobacco: Never  Vaping Use   Vaping Use: Never used   Substance and Sexual Activity   Alcohol use: Yes    Comment: 10 beers on the weekends   Drug use: No   Sexual activity: Not on file  Other Topics Concern   Not on file  Social History Narrative   Not on file   Social Determinants of Health   Financial Resource Strain: Not on file  Food Insecurity: Not on file  Transportation Needs: Not on file  Physical Activity: Not on file  Stress: Not on file  Social Connections: Not on file  Intimate Partner Violence: Not on file    Physical Exam: Vital signs in last 24 hours: @BP  122/79 (BP Location: Right Arm, Patient Position: Sitting, Cuff Size: Normal)   Pulse 94   Temp 98 F (36.7 C) (Temporal)   Ht 5\' 1"  (1.549 m)   Wt 125 lb (56.7 kg)   SpO2 100%   BMI 23.62 kg/m  GEN: NAD EYE: Sclerae anicteric ENT: MMM CV: Non-tachycardic Pulm: CTA b/l GI: Soft, NT/ND NEURO:  Alert & Oriented x 3   Erick Blinks, MD [facility]  04/18/2023 11:44 AM

## 2023-04-22 ENCOUNTER — Telehealth: Payer: Self-pay

## 2023-04-22 NOTE — Telephone Encounter (Signed)
  Follow up Call-     04/18/2023   11:06 AM 04/18/2023   10:56 AM  Call back number  Post procedure Call Back phone  # [phone]   Permission to leave phone message  Yes     Patient questions:  Do you have a fever, pain , or abdominal swelling? No. Pain Score  0 *  Have you tolerated food without any problems? Yes.    Have you been able to return to your normal activities? Yes.    Do you have any questions about your discharge instructions: Diet   No. Medications  No. Follow up visit  No.  Do you have questions or concerns about your Care? No.  Actions: * If pain score is 4 or above: No action needed, pain <4.

## 2024-07-05 ENCOUNTER — Telehealth: Payer: Self-pay | Admitting: Internal Medicine

## 2024-07-05 NOTE — Telephone Encounter (Signed)
 The pt has generalized abd discomfort that began 2 months ago.  She has started getting nausea recently.  She declines to see app and only wants to see Dr Albertus.  Appt made for 9/16 with Dr Albertus.  She will see PCP in the meantime.  She will call if she has any worse concerns if needed.

## 2024-07-05 NOTE — Telephone Encounter (Signed)
 Inbound call from patient stating she believes she has a ulcer. States she has abdominal pain. Patient requested to wait to see Dr. Albertus in November. Requesting a call to discuss options to help in the meantime. Please advise, thank you.

## 2024-08-10 ENCOUNTER — Ambulatory Visit: Admitting: Internal Medicine
